# Patient Record
Sex: Female | Born: 1985 | Race: Black or African American | Hispanic: No | Marital: Single | Smoking: Current every day smoker
Health system: Southern US, Community
[De-identification: ages and names within clinical notes are randomized; demographics above are authoritative.]

## PROBLEM LIST (undated history)

## (undated) DIAGNOSIS — E669 Obesity, unspecified: Secondary | ICD-10-CM

## (undated) DIAGNOSIS — J45909 Unspecified asthma, uncomplicated: Secondary | ICD-10-CM

## (undated) DIAGNOSIS — E559 Vitamin D deficiency, unspecified: Secondary | ICD-10-CM

## (undated) HISTORY — DX: Vitamin D deficiency, unspecified: E55.9

## (undated) HISTORY — DX: Obesity, unspecified: E66.9

## (undated) HISTORY — DX: Unspecified asthma, uncomplicated: J45.909

---

## 2013-07-24 ENCOUNTER — Other Ambulatory Visit: Payer: Self-pay | Admitting: Emergency Medicine

## 2013-07-24 DIAGNOSIS — R52 Pain, unspecified: Secondary | ICD-10-CM

## 2013-07-25 ENCOUNTER — Ambulatory Visit
Admission: RE | Admit: 2013-07-25 | Discharge: 2013-07-25 | Disposition: A | Discharge status: Home / Self Care | Payer: BC Managed Care – PPO | Source: Ambulatory Visit | Attending: Emergency Medicine | Admitting: Emergency Medicine

## 2013-07-25 DIAGNOSIS — R52 Pain, unspecified: Secondary | ICD-10-CM

## 2014-01-20 ENCOUNTER — Encounter (HOSPITAL_COMMUNITY): Payer: Self-pay | Admitting: Emergency Medicine

## 2014-01-20 ENCOUNTER — Emergency Department (HOSPITAL_COMMUNITY)
Admission: EM | Admit: 2014-01-20 | Discharge: 2014-01-20 | Disposition: A | Discharge status: Home / Self Care | Payer: BC Managed Care – PPO | Attending: Emergency Medicine | Admitting: Emergency Medicine

## 2014-01-20 ENCOUNTER — Emergency Department (HOSPITAL_COMMUNITY): Payer: BC Managed Care – PPO

## 2014-01-20 DIAGNOSIS — M25561 Pain in right knee: Secondary | ICD-10-CM

## 2014-01-20 DIAGNOSIS — M25569 Pain in unspecified knee: Secondary | ICD-10-CM | POA: Insufficient documentation

## 2014-01-20 DIAGNOSIS — S4980XA Other specified injuries of shoulder and upper arm, unspecified arm, initial encounter: Secondary | ICD-10-CM | POA: Insufficient documentation

## 2014-01-20 DIAGNOSIS — X500XXA Overexertion from strenuous movement or load, initial encounter: Secondary | ICD-10-CM | POA: Insufficient documentation

## 2014-01-20 DIAGNOSIS — Y9389 Activity, other specified: Secondary | ICD-10-CM | POA: Insufficient documentation

## 2014-01-20 DIAGNOSIS — M25511 Pain in right shoulder: Secondary | ICD-10-CM

## 2014-01-20 DIAGNOSIS — Y9289 Other specified places as the place of occurrence of the external cause: Secondary | ICD-10-CM | POA: Insufficient documentation

## 2014-01-20 DIAGNOSIS — M25562 Pain in left knee: Secondary | ICD-10-CM

## 2014-01-20 DIAGNOSIS — S46909A Unspecified injury of unspecified muscle, fascia and tendon at shoulder and upper arm level, unspecified arm, initial encounter: Secondary | ICD-10-CM | POA: Insufficient documentation

## 2014-01-20 NOTE — Discharge Instructions (Signed)
Ice your shoulder intermittently throughout the day. Take ibuprofen, tylenol or aleve for pain. Follow up with one of the resources below to establish care with a primary care doctor.  RESOURCE GUIDE  Chronic Pain Problems: Contact [facility]  [phone] Patients need to be referred by their primary care doctor.  Insufficient Money for Medicine: Contact [employer]:  call "211."   No Primary Care Doctor: - Call [facility]  [phone] - can help you locate a primary care doctor that  accepts your insurance, provides certain services, etc. - Physician Referral Service- [phone]  Agencies that provide inexpensive medical care: - [facility]  [phone] - [facility]  [phone] - [facility]  [phone] - [facility]  [phone] - [facility]  [phone] - [facility]  [phone]  Medicaid-accepting [facility] Providers: - [facility]- [address]  [phone], Mon-Fri 9am-7pm, Sat 9am-1pm - [facility]- [address] [place], [address] [place]  [phone] - [facility]- [address], [address] [place]  [phone] [facility]- [address]  [phone] - Renaye Rakers- [address] [place], [address], [phone]  Only accepts [place] patients after they have their name  applied to their card  Self Pay (no insurance) in [place]: - Sickle Cell Patients: Dr Willey Blade, [facility]  [address] [place], [phone] - [facility]- [address] [place]  [phone]       [facility]- [address]       -     [facility]- see information above (Speak to [employer] if you do not have insurance)       -  [facility]- [address],  [phone]       -  [facility]- [address], [phone]       -  Dr  Julio Sicks-  [address], [place], [phone]       -  [facility] - [address], [phone]       -  [facility]- [address] [phone], also [address], [phone]       -    [facility]- [address] [place], [phone], 1st & 3rd Saturday        every month, 10am-1pm  1) Find a Doctor and Pay Out of Pocket Although you won't have to find out who is covered by your insurance plan, it is a good idea to ask around and get recommendations. You will then need to call the office and see if the doctor you have chosen will accept you as a new patient and what types of options they offer for patients who are self-pay. Some doctors offer discounts or will set up payment plans for their patients who do not have insurance, but you will need to ask so you aren't surprised when you get to your appointment.  2) Contact Your Local Health Department Not all health departments have doctors that can see patients for sick visits, but many do, so it is worth a call to see if yours does. If you don't know where your local health department is, you can check in your phone book. The CDC also  has a tool to help you locate your state's health department, and many state websites also have listings of all of their local health departments.  3) Find a Walk-in Clinic If your illness is not likely to be very severe or complicated, you may want to try a walk in clinic. These are popping up all over the country in pharmacies, drugstores, and shopping centers. They're usually staffed by nurse practitioners or physician assistants that have been trained to treat common illnesses and complaints. They're usually fairly quick and inexpensive. However, if you have serious medical issues or chronic medical problems, these are probably not your best option  Knee Pain The knee is the complex joint between your thigh and your lower leg. It is made up of  bones, tendons, ligaments, and cartilage. The bones that make up the knee are:  The femur in the thigh.  The tibia and fibula in the lower leg.  The patella or kneecap riding in the groove on the lower femur. CAUSES  Knee pain is a common complaint with many causes. A few of these causes are:  Injury, such as:  A ruptured ligament or tendon injury.  Torn cartilage.  Medical conditions, such as:  Gout  Arthritis  Infections  Overuse, over training, or overdoing a physical activity. Knee pain can be minor or severe. Knee pain can accompany debilitating injury. Minor knee problems often respond well to self-care measures or get well on their own. More serious injuries may need medical intervention or even surgery. SYMPTOMS The knee is complex. Symptoms of knee problems can vary widely. Some of the problems are:  Pain with movement and weight bearing.  Swelling and tenderness.  Buckling of the knee.  Inability to straighten or extend your knee.  Your knee locks and you cannot straighten it.  Warmth and redness with pain and fever.  Deformity or dislocation of the kneecap. DIAGNOSIS  Determining what is wrong may be very straight forward such as when there is an injury. It can also be challenging because of the complexity of the knee. Tests to make a diagnosis may include:  Your caregiver taking a history and doing a physical exam.  Routine X-rays can be used to rule out other problems. X-rays will not reveal a cartilage tear. Some injuries of the knee can be diagnosed by:  Arthroscopy a surgical technique by which a small video camera is inserted through tiny incisions on the sides of the knee. This procedure is used to examine and repair internal knee joint problems. Tiny instruments can be used during arthroscopy to repair the torn knee cartilage (meniscus).  Arthrography is a radiology technique. A contrast liquid is directly injected into the knee joint. Internal  structures of the knee joint then become visible on X-ray film.  An MRI scan is a non X-ray radiology procedure in which magnetic fields and a computer produce two- or three-dimensional images of the inside of the knee. Cartilage tears are often visible using an MRI scanner. MRI scans have largely replaced arthrography in diagnosing cartilage tears of the knee.  Blood work.  Examination of the fluid that helps to lubricate the knee joint (synovial fluid). This is done by taking a sample out using a needle and a syringe. TREATMENT The treatment of knee problems depends on the cause. Some of these treatments are:  Depending on the injury, proper casting, splinting, surgery, or physical therapy care will be needed.  Give yourself adequate recovery time. Do not overuse your  joints. If you begin to get sore during workout routines, back off. Slow down or do fewer repetitions.  For repetitive activities such as cycling or running, maintain your strength and nutrition.  Alternate muscle groups. For example, if you are a weight lifter, work the upper body on one day and the lower body the next.  Either tight or weak muscles do not give the proper support for your knee. Tight or weak muscles do not absorb the stress placed on the knee joint. Keep the muscles surrounding the knee strong.  Take care of mechanical problems.  If you have flat feet, orthotics or special shoes may help. See your caregiver if you need help.  Arch supports, sometimes with wedges on the inner or outer aspect of the heel, can help. These can shift pressure away from the side of the knee most bothered by osteoarthritis.  A brace called an "unloader" brace also may be used to help ease the pressure on the most arthritic side of the knee.  If your caregiver has prescribed crutches, braces, wraps or ice, use as directed. The acronym for this is PRICE. This means protection, rest, ice, compression, and elevation.  Nonsteroidal  anti-inflammatory drugs (NSAIDs), can help relieve pain. But if taken immediately after an injury, they may actually increase swelling. Take NSAIDs with food in your stomach. Stop them if you develop stomach problems. Do not take these if you have a history of ulcers, stomach pain, or bleeding from the bowel. Do not take without your caregiver's approval if you have problems with fluid retention, heart failure, or kidney problems.  For ongoing knee problems, physical therapy may be helpful.  Glucosamine and chondroitin are over-the-counter dietary supplements. Both may help relieve the pain of osteoarthritis in the knee. These medicines are different from the usual anti-inflammatory drugs. Glucosamine may decrease the rate of cartilage destruction.  Injections of a corticosteroid drug into your knee joint may help reduce the symptoms of an arthritis flare-up. They may provide pain relief that lasts a few months. You may have to wait a few months between injections. The injections do have a small increased risk of infection, water retention, and elevated blood sugar levels.  Hyaluronic acid injected into damaged joints may ease pain and provide lubrication. These injections may work by reducing inflammation. A series of shots may give relief for as long as 6 months.  Topical painkillers. Applying certain ointments to your skin may help relieve the pain and stiffness of osteoarthritis. Ask your pharmacist for suggestions. Many over the-counter products are approved for temporary relief of arthritis pain.  In some countries, doctors often prescribe topical NSAIDs for relief of chronic conditions such as arthritis and tendinitis. A review of treatment with NSAID creams found that they worked as well as oral medications but without the serious side effects. PREVENTION  Maintain a healthy weight. Extra pounds put more strain on your joints.  Get strong, stay limber. Weak muscles are a common cause of knee  injuries. Stretching is important. Include flexibility exercises in your workouts.  Be smart about exercise. If you have osteoarthritis, chronic knee pain or recurring injuries, you may need to change the way you exercise. This does not mean you have to stop being active. If your knees ache after jogging or playing basketball, consider switching to swimming, water aerobics, or other low-impact activities, at least for a few days a week. Sometimes limiting high-impact activities will provide relief.  Make sure your shoes fit well. Choose  footwear that is right for your sport.  Protect your knees. Use the proper gear for knee-sensitive activities. Use kneepads when playing volleyball or laying carpet. Buckle your seat belt every time you drive. Most shattered kneecaps occur in car accidents.  Rest when you are tired. SEEK MEDICAL CARE IF:  You have knee pain that is continual and does not seem to be getting better.  SEEK IMMEDIATE MEDICAL CARE IF:  Your knee joint feels hot to the touch and you have a high fever. MAKE SURE YOU:   Understand these instructions.  Will watch your condition.  Will get help right away if you are not doing well or get worse. Document Released: 03/08/2007 Document Revised: 08/03/2011 Document Reviewed: 03/08/2007 [facility] Patient Information 2015 [place], [place]. This information is not intended to replace advice given to you by your health care provider. Make sure you discuss any questions you have with your health care provider.  Shoulder Pain The shoulder is the joint that connects your arms to your body. The bones that form the shoulder joint include the upper arm bone (humerus), the shoulder blade (scapula), and the collarbone (clavicle). The top of the humerus is shaped like a ball and fits into a rather flat socket on the scapula (glenoid cavity). A combination of muscles and strong, fibrous tissues that connect muscles to bones (tendons) support your shoulder  joint and hold the ball in the socket. Small, fluid-filled sacs (bursae) are located in different areas of the joint. They act as cushions between the bones and the overlying soft tissues and help reduce friction between the gliding tendons and the bone as you move your arm. Your shoulder joint allows a wide range of motion in your arm. This range of motion allows you to do things like scratch your back or throw a ball. However, this range of motion also makes your shoulder more prone to pain from overuse and injury. Causes of shoulder pain can originate from both injury and overuse and usually can be grouped in the following four categories:  Redness, swelling, and pain (inflammation) of the tendon (tendinitis) or the bursae (bursitis).  Instability, such as a dislocation of the joint.  Inflammation of the joint (arthritis).  Broken bone (fracture). HOME CARE INSTRUCTIONS   Apply ice to the sore area.  Put ice in a plastic bag.  Place a towel between your skin and the bag.  Leave the ice on for 15-20 minutes, 3-4 times per day for the first 2 days, or as directed by your health care provider.  Stop using cold packs if they do not help with the pain.  If you have a shoulder sling or immobilizer, wear it as long as your caregiver instructs. Only remove it to shower or bathe. Move your arm as little as possible, but keep your hand moving to prevent swelling.  Squeeze a soft ball or foam pad as much as possible to help prevent swelling.  Only take over-the-counter or prescription medicines for pain, discomfort, or fever as directed by your caregiver. SEEK MEDICAL CARE IF:   Your shoulder pain increases, or new pain develops in your arm, hand, or fingers.  Your hand or fingers become cold and numb.  Your pain is not relieved with medicines. SEEK IMMEDIATE MEDICAL CARE IF:   Your arm, hand, or fingers are numb or tingling.  Your arm, hand, or fingers are significantly swollen or turn  white or blue. MAKE SURE YOU:   Understand these instructions.  Will  watch your condition.  Will get help right away if you are not doing well or get worse. Document Released: 02/18/2005 Document Revised: 09/25/2013 Document Reviewed: 04/25/2011 [facility] Patient Information 2015 [place], [place]. This information is not intended to replace advice given to you by your health care provider. Make sure you discuss any questions you have with your health care provider.

## 2014-01-20 NOTE — ED Provider Notes (Signed)
CSN: [id number]     Arrival date & time 01/20/14  4098 History   First MD Initiated Contact with Patient 01/20/14 1005     Chief Complaint  Patient presents with  . Shoulder Pain     (Consider location/radiation/quality/duration/timing/severity/associated sxs/prior Treatment) HPI Comments: Patient is a 28 year old female who presents to the emergency department complaining of right shoulder pain x2 days. Patient reports she jumped out of a trailer 2 days ago and felt her right shoulder "pop". Pain has been intermittent since, worse when she moves her arm out to the side, temporarily relief by Aleve. Pain rated 3/10. Denies numbness or tingling. Patient is also complaining of bilateral knee pain that has been present for 5 months. She reports she was playing softball when she started to experience left knee pain, shortly after experienced right knee pain. Pain worse she walks for a long period of time. She does not have a primary care physician.  The history is provided by the patient.    History reviewed. No pertinent past medical history. History reviewed. No pertinent past surgical history. History reviewed. No pertinent family history. History  Substance Use Topics  . Smoking status: Never Smoker   . Smokeless tobacco: Not on file  . Alcohol Use: Yes   OB History   Grav Para Term Preterm Abortions TAB SAB Ect Mult Living                 Review of Systems  Constitutional: Negative.   HENT: Negative.   Cardiovascular: Negative.   Musculoskeletal:       + R shoulder pain. + bilateral knee pain.  Skin: Negative.       Allergies  Diflucan  Home Medications   Prior to Admission medications   Not on File   BP 113/68  Pulse 75  Temp(Src) 98.5 F (36.9 C) (Oral)  Resp 20  SpO2 100%  LMP 12/01/2013 Physical Exam  Nursing note and vitals reviewed. Constitutional: She is oriented to person, place, and time. She appears well-developed and well-nourished. No distress.   HENT:  Head: Normocephalic and atraumatic.  Mouth/Throat: Oropharynx is clear and moist.  Eyes: Conjunctivae and EOM are normal.  Neck: Normal range of motion. Neck supple.  Cardiovascular: Normal rate, regular rhythm, normal heart sounds and intact distal pulses.   Pulmonary/Chest: Effort normal and breath sounds normal. No respiratory distress.  Musculoskeletal: Normal range of motion. She exhibits no edema.  Right shoulder TTP over acromion process. FROM, pain with abduction. No swelling or deformity. Bilateral knees with mild tenderness around patella area. Full range of motion. No crepitus. No ligamentous laxity. No swelling or deformity. Normal gait.  Neurological: She is alert and oriented to person, place, and time. No sensory deficit.  Skin: Skin is warm and dry.  Psychiatric: She has a normal mood and affect. Her behavior is normal.    ED Course  Procedures (including critical care time) Labs Review Labs Reviewed - No data to display  Imaging Review Dg Shoulder Right  01/20/2014   CLINICAL DATA:  Injury.  Right shoulder pain.  EXAM: RIGHT SHOULDER - 2+ VIEW  COMPARISON:  None.  FINDINGS: There is no evidence of fracture or dislocation. There is no evidence of arthropathy or other focal bone abnormality. Soft tissues are unremarkable.  IMPRESSION: Negative.   Electronically Signed   By: Amie Portland M.D.   On: 01/20/2014 11:29     EKG Interpretation None      MDM   Final  diagnoses:  Right shoulder pain  Bilateral knee pain    With right shoulder pain after injury. She is well appearing and in no apparent distress. Neurovascularly intact. Vital signs stable. X-ray without any acute findings. Reguarding knee pain, this has been ongoing for about 5 months. I do not feel imaging studies are necessary at this time. Ambulating without difficulty. Resources given for PCP followup. Advised rest, ice and NSAIDs. Stable for discharge. Return precautions given. Patient states  understanding of treatment care plan and is agreeable.    Trevor Mace, PA-C 01/20/14 1141

## 2014-01-20 NOTE — ED Notes (Signed)
Reports right shoulder pain/inj onset Thursday am.  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

## 2014-01-22 NOTE — ED Provider Notes (Signed)
Medical screening examination/treatment/procedure(s) were performed by non-physician practitioner and as supervising physician I was immediately available for consultation/collaboration.   EKG Interpretation None        Mehlani Blankenburg, MD 01/22/14 1117 

## 2014-08-10 IMAGING — US US TRANSVAGINAL NON-OB
1 series · 14 of 25 positions shown · non-contrast
Comparison: None

CLINICAL DATA: Left-sided pelvic pain.  LMP 07/25/2013.

EXAM:
TRANSABDOMINAL AND TRANSVAGINAL ULTRASOUND OF PELVIS
TECHNIQUE: Both transabdominal and transvaginal ultrasound examinations of the
pelvis were performed. Transabdominal technique was performed for
global imaging of the pelvis including uterus, ovaries, adnexal
regions, and pelvic cul-de-sac. It was necessary to proceed with
endovaginal exam following the transabdominal exam to visualize the
endometrium and ovaries.

[Series 1: us transvaginal non-ob · 0.28mm/px · 14 of 74 slices shown]
[im 1/74]
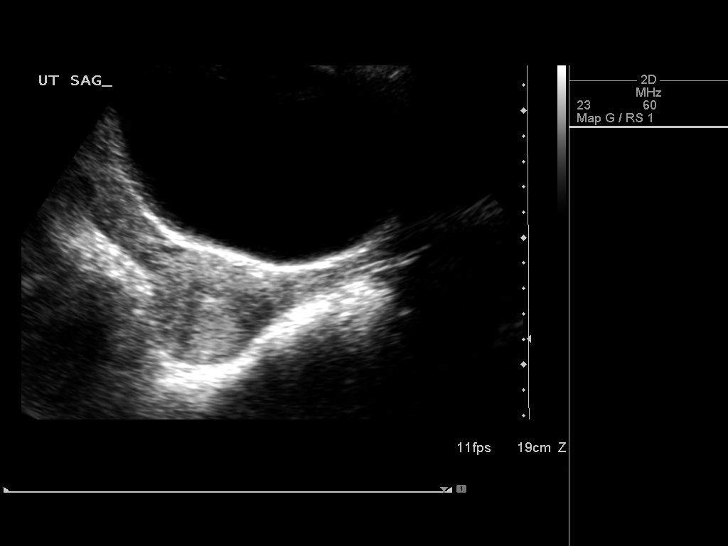
[im 7/74]
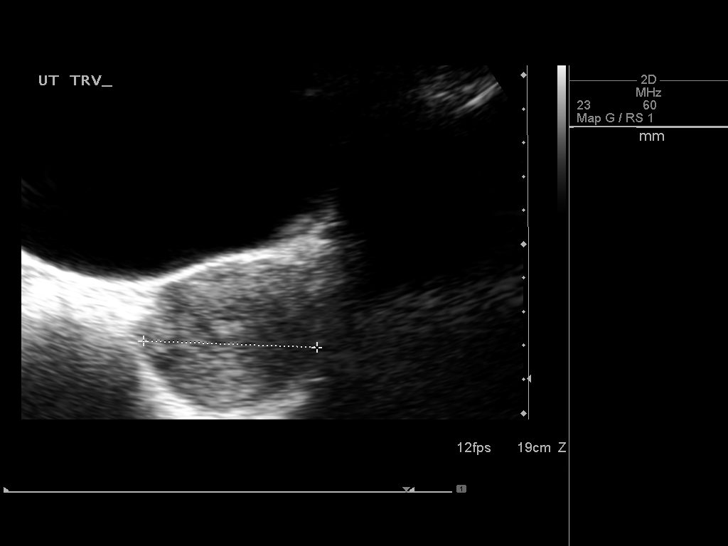
[im 13/74]
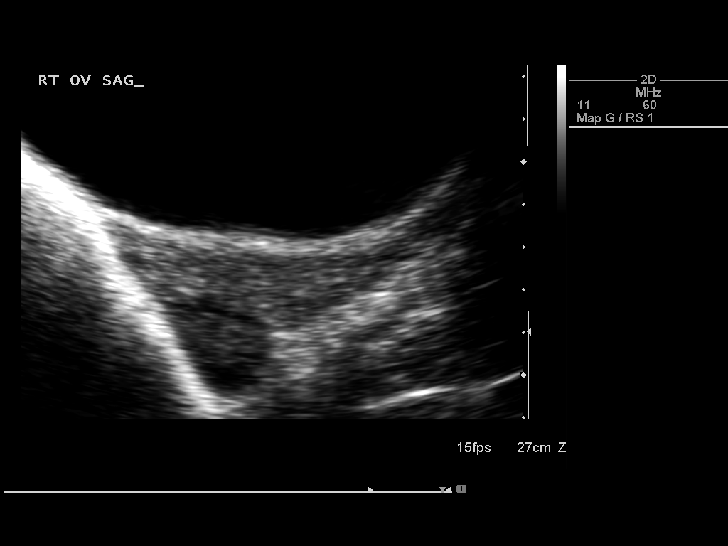
[im 19/74]
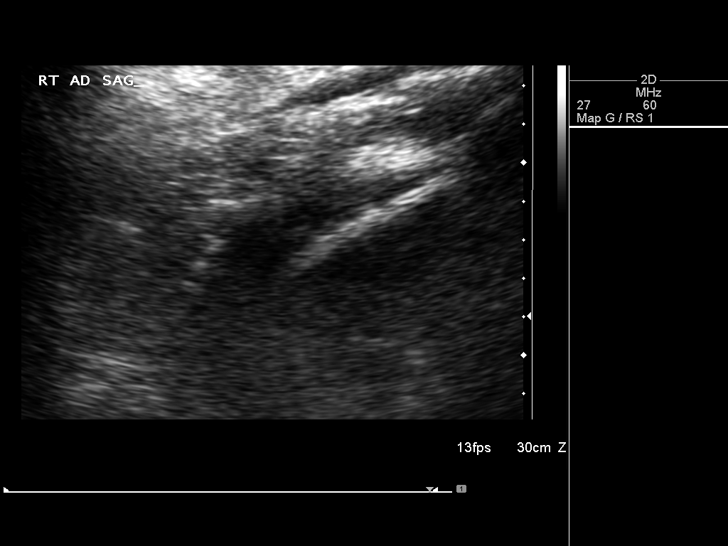
[im 25/74]
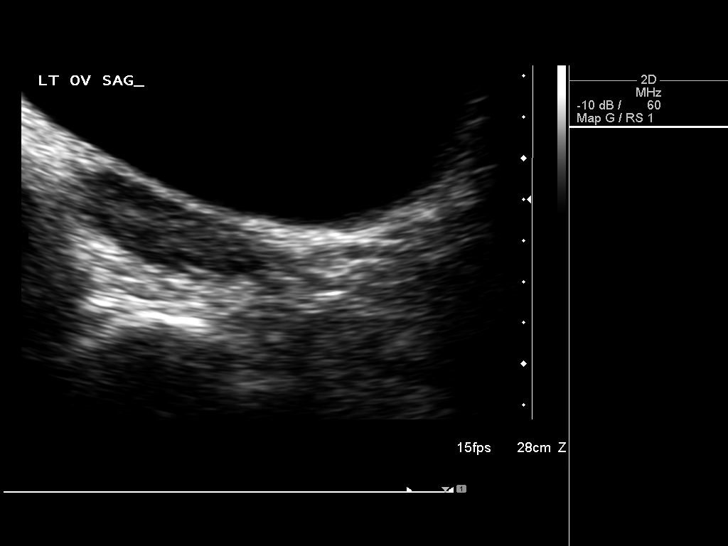
[im 28/74]
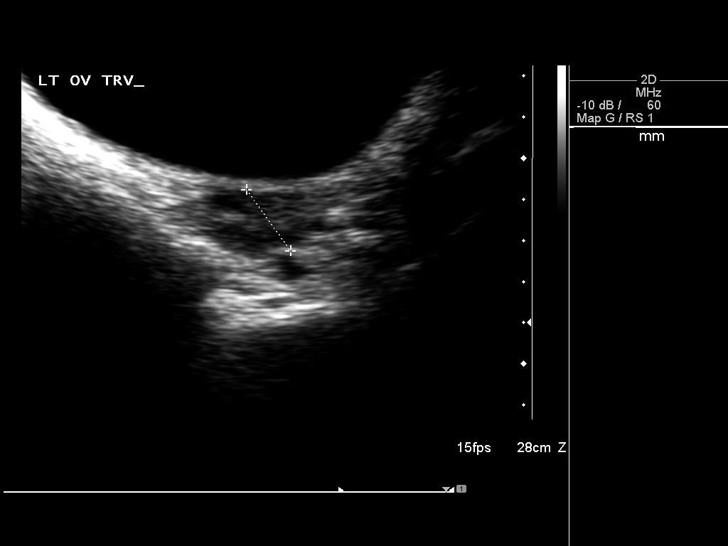
[im 34/74]
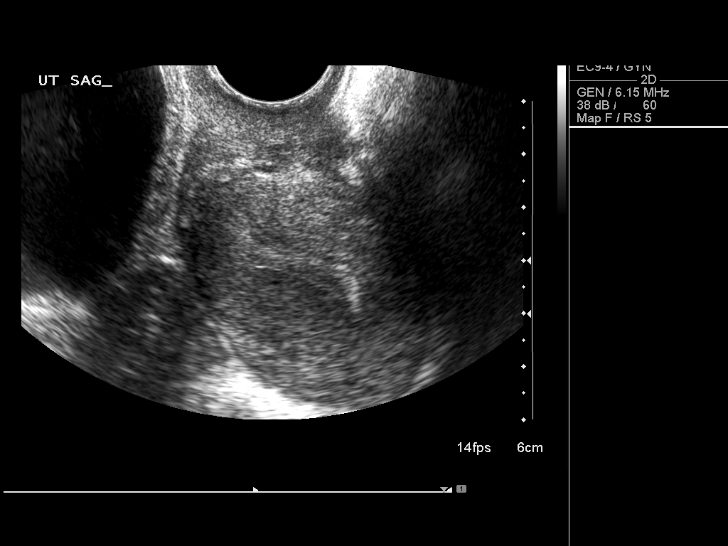
[im 40/74]
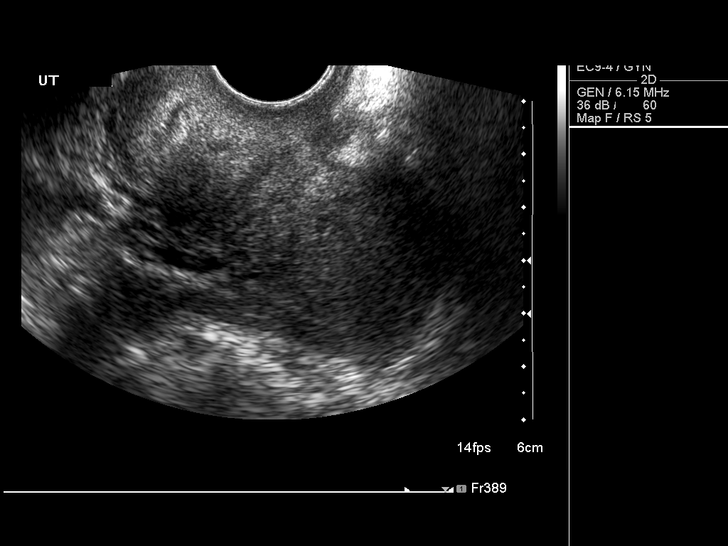
[im 46/74]
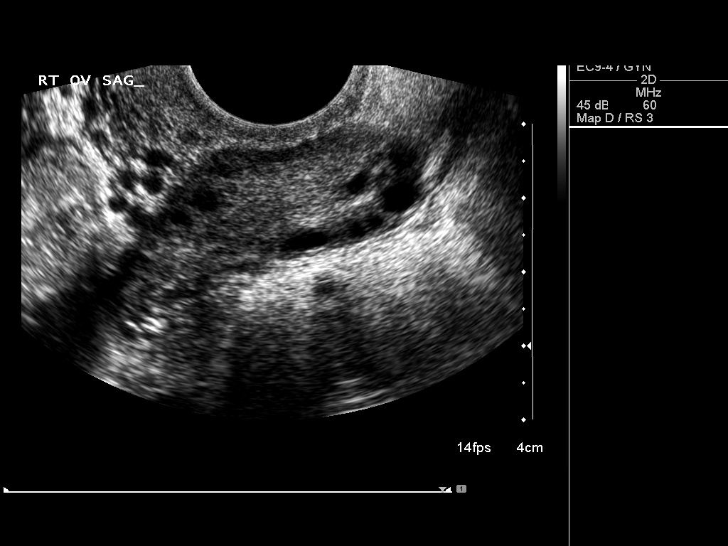
[im 49/74]
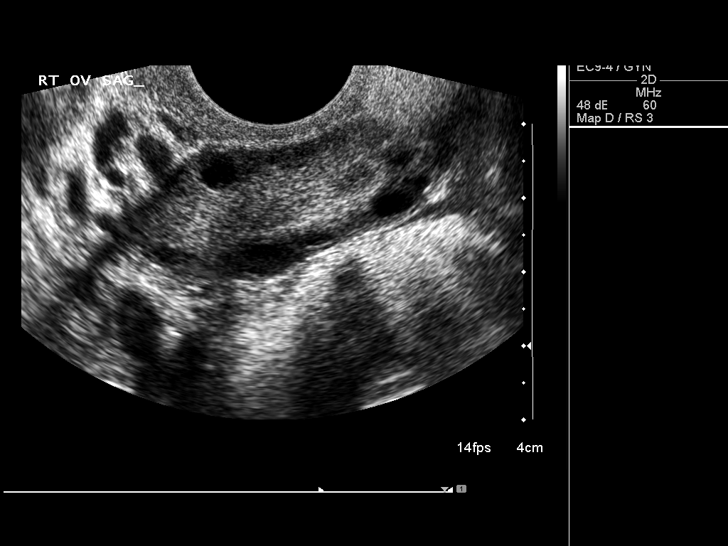
[im 55/74]
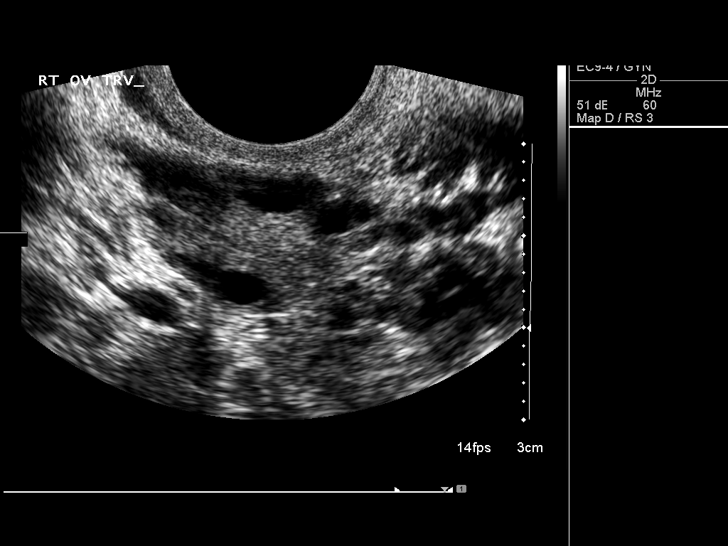
[im 61/74]
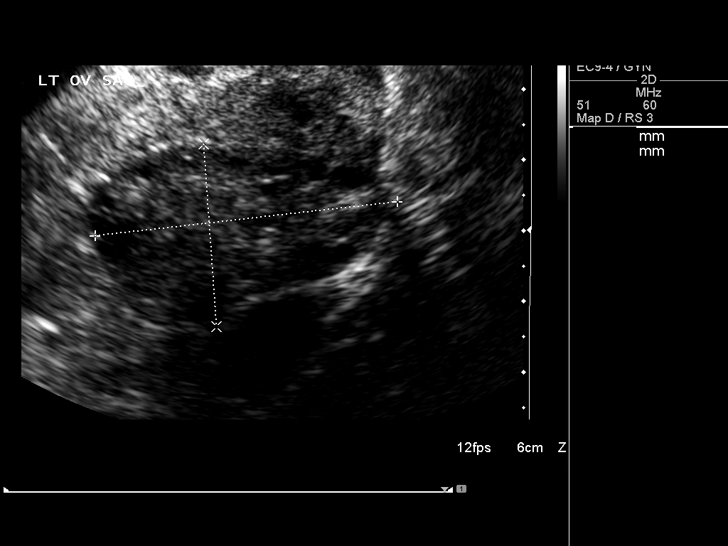
[im 67/74]
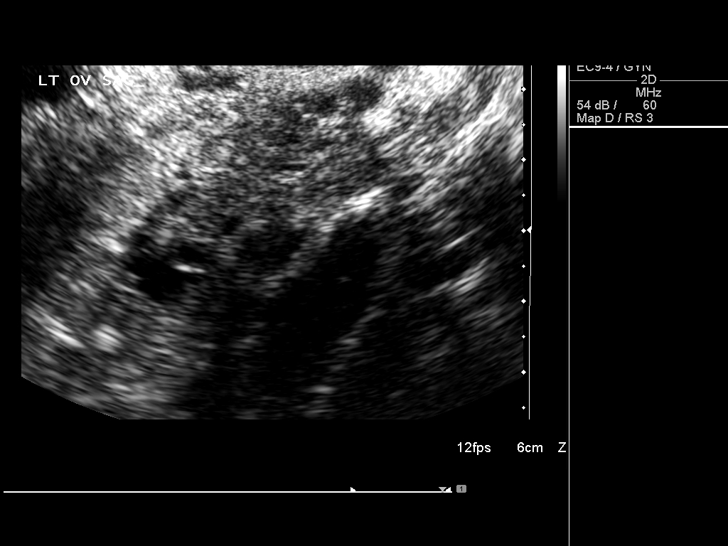
[im 74/74]
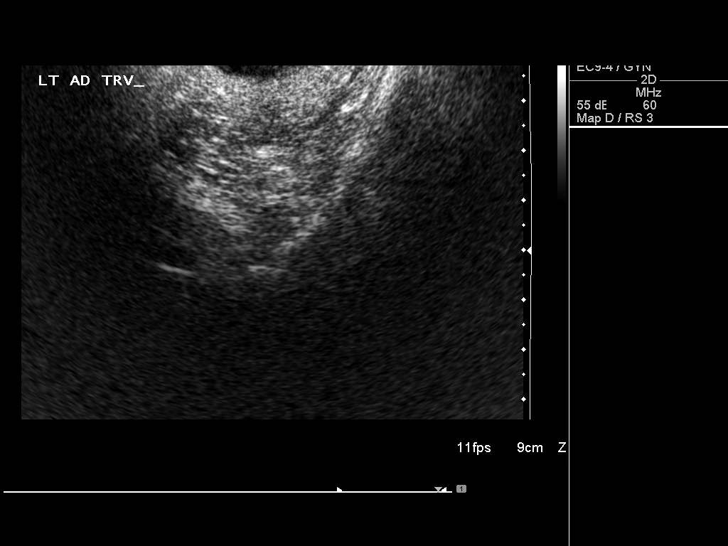

[14 of 25 positions shown; findings below may reference images not displayed]

FINDINGS: Uterus

Measurements: 6.7 x 4.6 x 4.4 cm. Retroverted. No fibroids or other
mass visualized.

Endometrium

Thickness: 8 mm.  No focal abnormality visualized.

Right ovary

Measurements: 4.8 x 1.7 x 2.4 cm. Normal appearance/no adnexal mass.

Left ovary

Measurements: 4.3 x 2.6 x 2.1 cm. Normal appearance/no adnexal mass.

Other findings

No free fluid.
IMPRESSION: Normal appearance of uterus and both ovaries. No pelvic mass or
other significant abnormality identified.

## 2015-01-21 ENCOUNTER — Other Ambulatory Visit (HOSPITAL_COMMUNITY): Payer: Self-pay | Admitting: Student

## 2015-01-21 DIAGNOSIS — R0789 Other chest pain: Secondary | ICD-10-CM

## 2015-01-21 DIAGNOSIS — R0602 Shortness of breath: Secondary | ICD-10-CM

## 2015-01-24 ENCOUNTER — Ambulatory Visit (HOSPITAL_COMMUNITY)
Admission: RE | Admit: 2015-01-24 | Discharge: 2015-01-24 | Disposition: A | Discharge status: Home / Self Care | Payer: PRIVATE HEALTH INSURANCE | Source: Ambulatory Visit | Attending: Internal Medicine | Admitting: Internal Medicine

## 2015-01-24 DIAGNOSIS — R079 Chest pain, unspecified: Secondary | ICD-10-CM | POA: Insufficient documentation

## 2015-01-24 DIAGNOSIS — I371 Nonrheumatic pulmonary valve insufficiency: Secondary | ICD-10-CM | POA: Insufficient documentation

## 2015-01-24 DIAGNOSIS — R0789 Other chest pain: Secondary | ICD-10-CM | POA: Diagnosis not present

## 2015-01-24 DIAGNOSIS — R0602 Shortness of breath: Secondary | ICD-10-CM

## 2015-01-24 NOTE — Progress Notes (Signed)
Echocardiogram 2D Echocardiogram has been performed.  Ashley Manning 01/24/2015, 11:03 AM

## 2015-02-05 IMAGING — CR DG SHOULDER 2+V*R*
3 series · 3 of 3 positions shown · non-contrast
Comparison: None.

CLINICAL DATA: Injury.  Right shoulder pain.

EXAM:
RIGHT SHOULDER - 2+ VIEW

[w shoulder ap internal righ *]
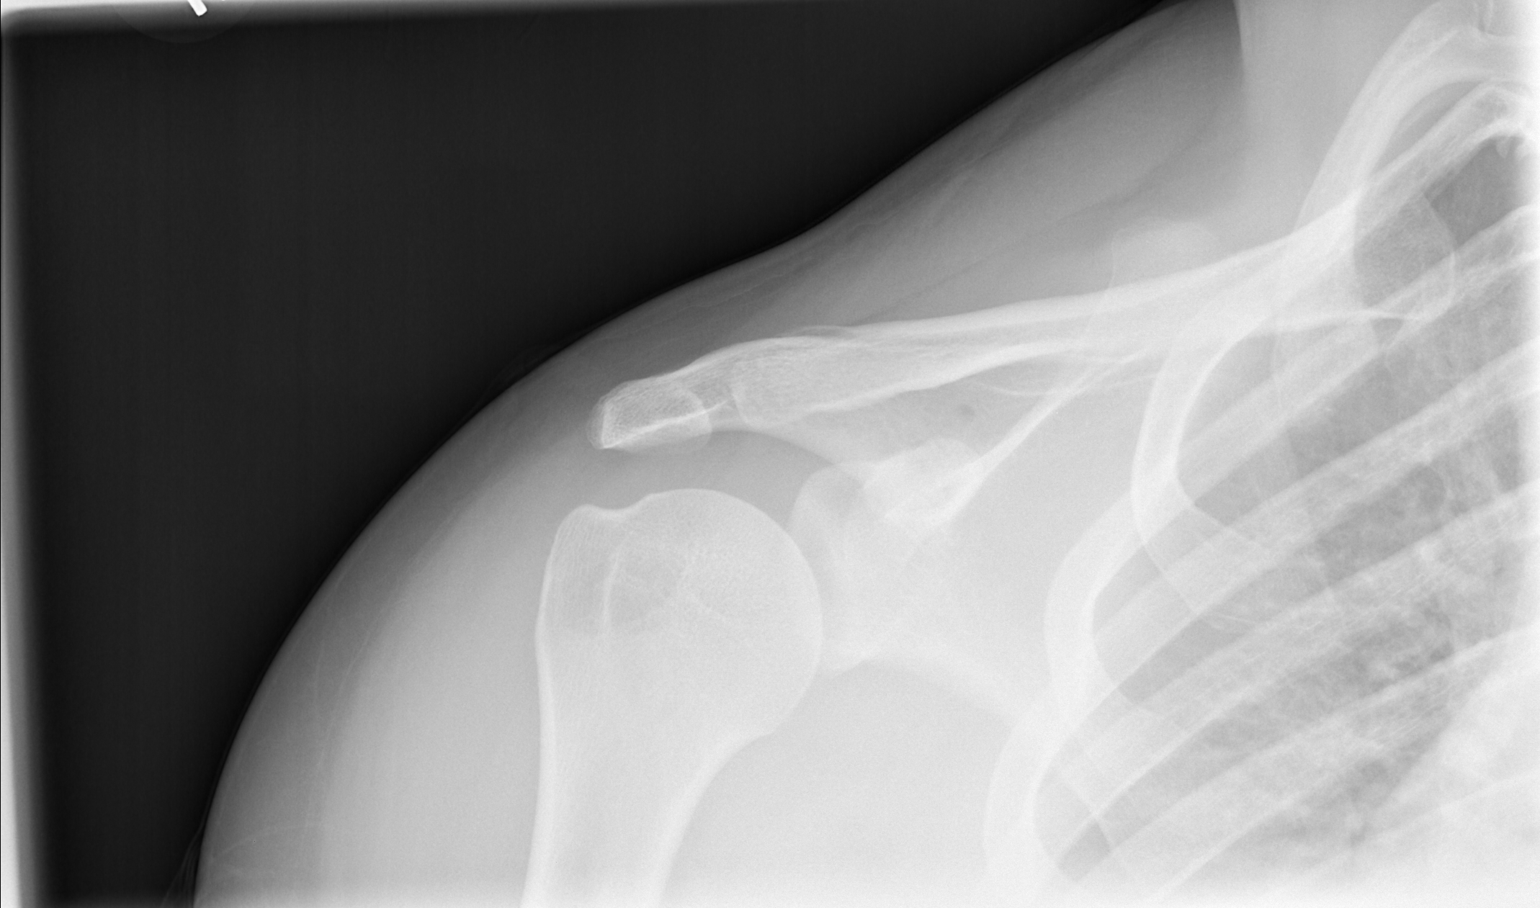

[w shoulder y view right *]
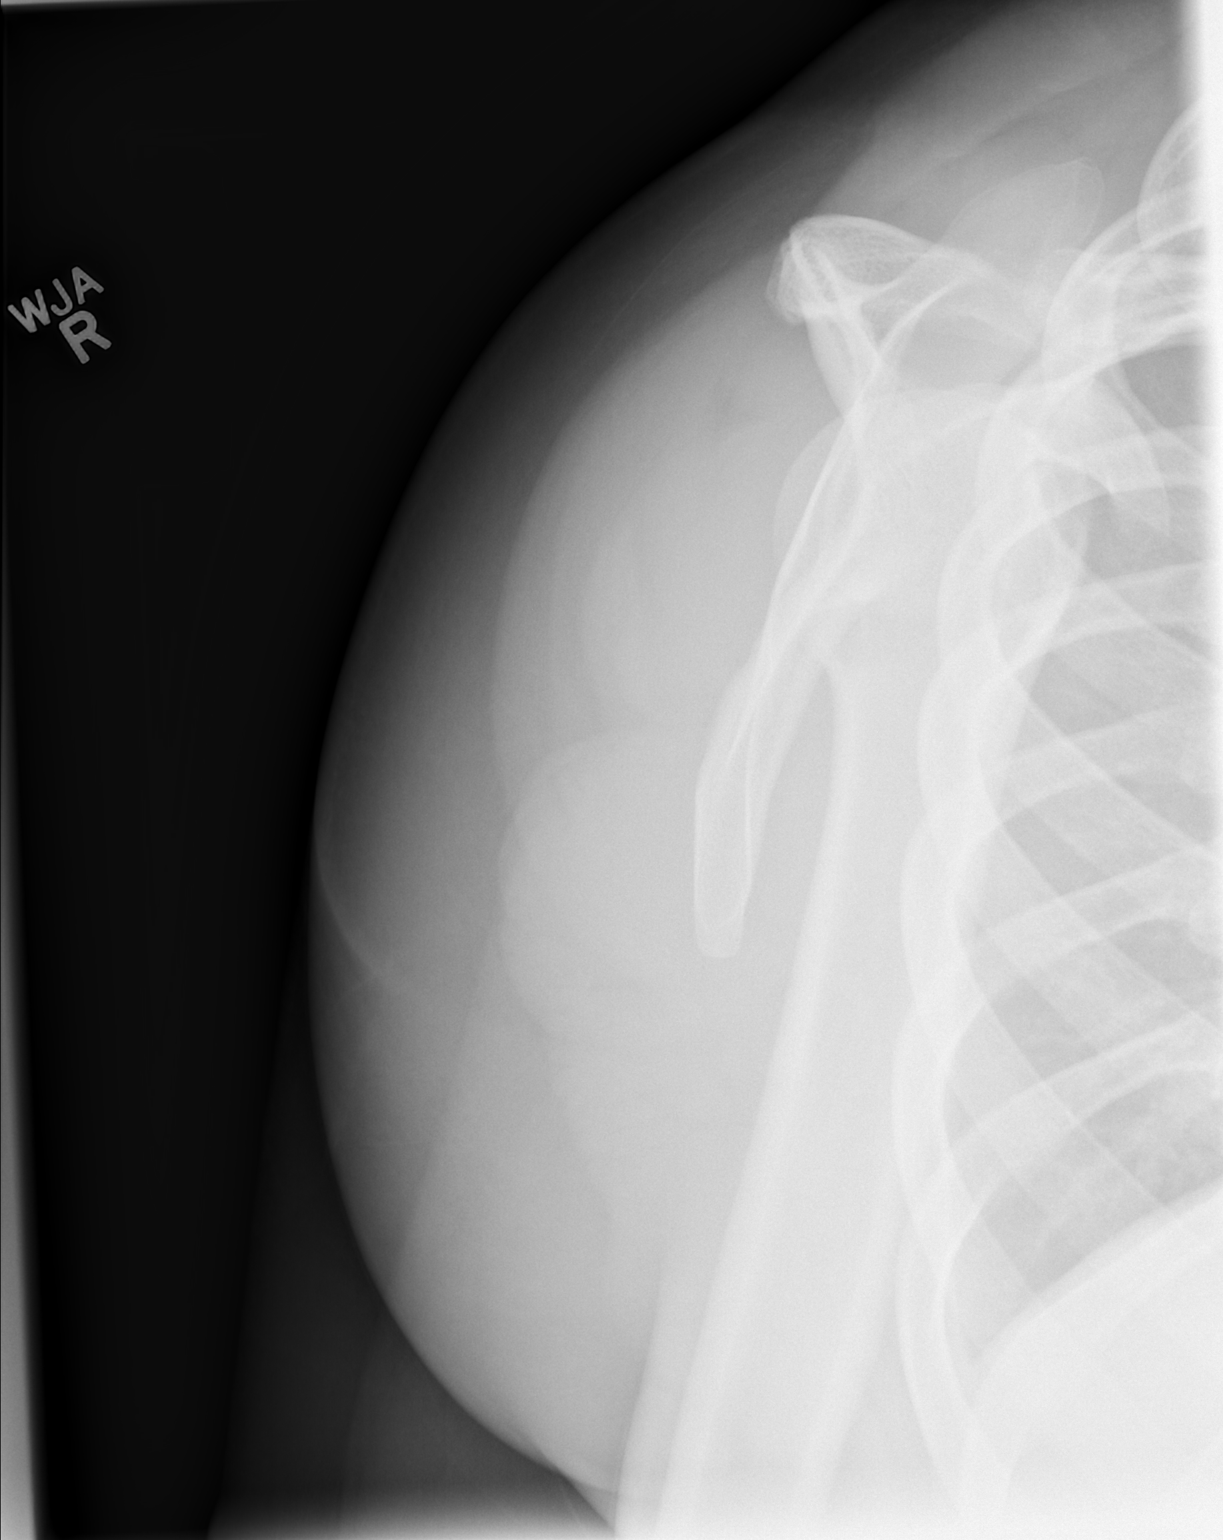

[x shoulder axillary right]
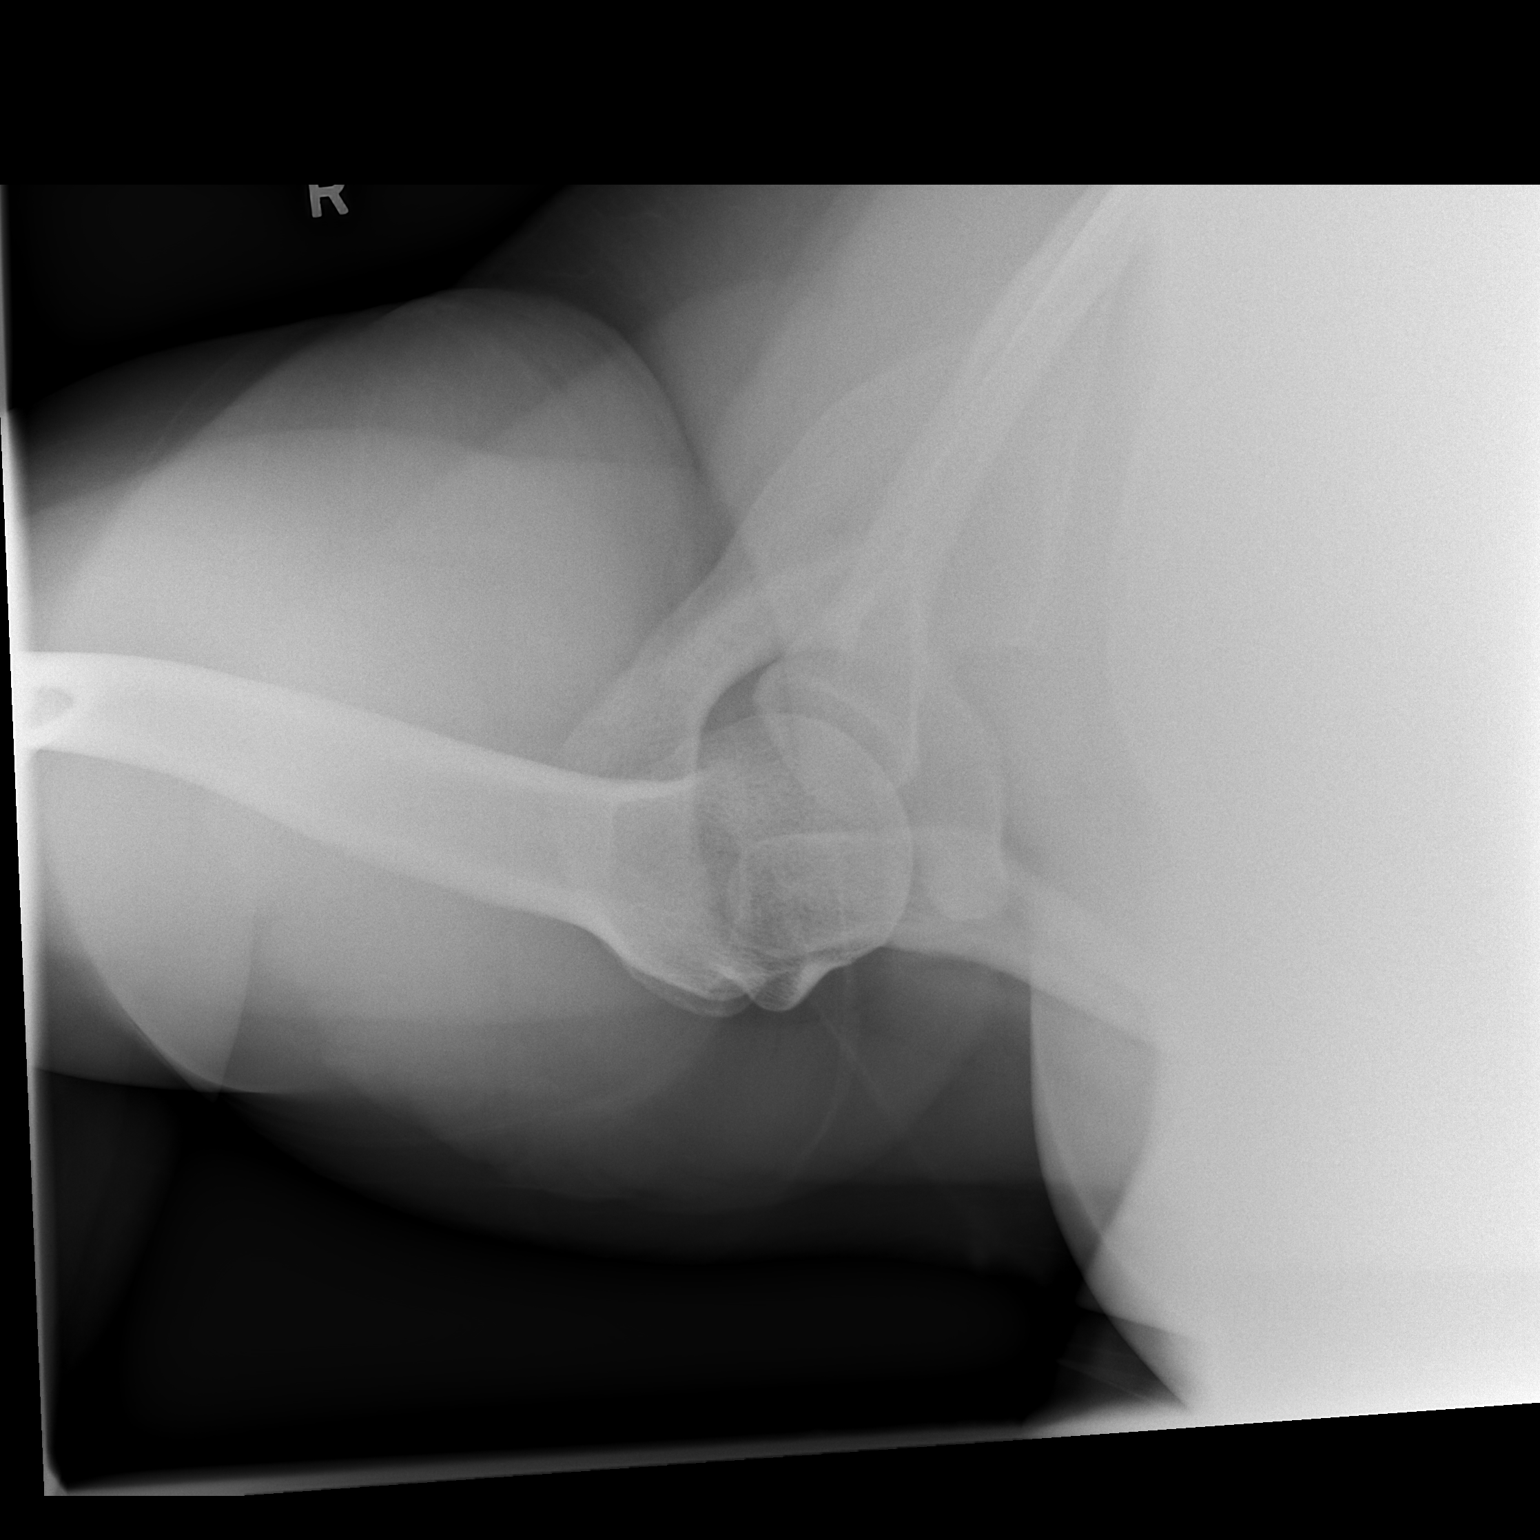

[3 of 3 positions shown; findings below may reference images not displayed]

FINDINGS: There is no evidence of fracture or dislocation. There is no
evidence of arthropathy or other focal bone abnormality. Soft
tissues are unremarkable.
IMPRESSION: Negative.

## 2016-08-18 ENCOUNTER — Encounter: Payer: Self-pay | Admitting: Family Medicine

## 2016-08-18 ENCOUNTER — Ambulatory Visit (INDEPENDENT_AMBULATORY_CARE_PROVIDER_SITE_OTHER): Payer: No Typology Code available for payment source | Admitting: Family Medicine

## 2016-08-18 ENCOUNTER — Other Ambulatory Visit (HOSPITAL_COMMUNITY)
Admission: RE | Admit: 2016-08-18 | Discharge: 2016-08-18 | Disposition: A | Discharge status: Home / Self Care | Payer: PRIVATE HEALTH INSURANCE | Source: Ambulatory Visit | Attending: Family Medicine | Admitting: Family Medicine

## 2016-08-18 VITALS — BP 116/78 | HR 75 | Ht 66.0 in | Wt 250.2 lb

## 2016-08-18 DIAGNOSIS — Z124 Encounter for screening for malignant neoplasm of cervix: Secondary | ICD-10-CM

## 2016-08-18 DIAGNOSIS — Z113 Encounter for screening for infections with a predominantly sexual mode of transmission: Secondary | ICD-10-CM | POA: Insufficient documentation

## 2016-08-18 DIAGNOSIS — E669 Obesity, unspecified: Secondary | ICD-10-CM

## 2016-08-18 DIAGNOSIS — H6122 Impacted cerumen, left ear: Secondary | ICD-10-CM | POA: Diagnosis not present

## 2016-08-18 DIAGNOSIS — Z0000 Encounter for general adult medical examination without abnormal findings: Secondary | ICD-10-CM | POA: Diagnosis not present

## 2016-08-18 DIAGNOSIS — Z8349 Family history of other endocrine, nutritional and metabolic diseases: Secondary | ICD-10-CM

## 2016-08-18 HISTORY — DX: Obesity, unspecified: E66.9

## 2016-08-18 LAB — POCT URINALYSIS DIPSTICK
Bilirubin, UA: NEGATIVE
Blood, UA: NEGATIVE
Glucose, UA: NEGATIVE
Ketones, UA: NEGATIVE
Leukocytes, UA: NEGATIVE
Nitrite, UA: POSITIVE
Protein, UA: NEGATIVE
Spec Grav, UA: 1.025
Urobilinogen, UA: NEGATIVE
pH, UA: 6

## 2016-08-18 LAB — CBC WITH DIFFERENTIAL/PLATELET
BASOS ABS: 62 {cells}/uL (ref 0–200)
Basophils Relative: 1 %
EOS PCT: 5 %
Eosinophils Absolute: 310 cells/uL (ref 15–500)
HEMATOCRIT: 43.3 % (ref 35.0–45.0)
HEMOGLOBIN: 13.9 g/dL (ref 11.7–15.5)
LYMPHS ABS: 1798 {cells}/uL (ref 850–3900)
Lymphocytes Relative: 29 %
MCH: 28.1 pg (ref 27.0–33.0)
MCHC: 32.1 g/dL (ref 32.0–36.0)
MCV: 87.5 fL (ref 80.0–100.0)
MONO ABS: 372 {cells}/uL (ref 200–950)
MPV: 11.4 fL (ref 7.5–12.5)
Monocytes Relative: 6 %
NEUTROS PCT: 59 %
Neutro Abs: 3658 cells/uL (ref 1500–7800)
Platelets: 319 10*3/uL (ref 140–400)
RBC: 4.95 MIL/uL (ref 3.80–5.10)
RDW: 12.7 % (ref 11.0–15.0)
WBC: 6.2 10*3/uL (ref 4.0–10.5)

## 2016-08-18 LAB — COMPREHENSIVE METABOLIC PANEL WITH GFR
ALT: 14 U/L (ref 6–29)
AST: 25 U/L (ref 10–30)
Albumin: 4.2 g/dL (ref 3.6–5.1)
Alkaline Phosphatase: 57 U/L (ref 33–115)
BUN: 16 mg/dL (ref 7–25)
CO2: 18 mmol/L — ABNORMAL LOW (ref 20–31)
Calcium: 9.4 mg/dL (ref 8.6–10.2)
Chloride: 106 mmol/L (ref 98–110)
Creat: 0.98 mg/dL (ref 0.50–1.10)
Glucose, Bld: 91 mg/dL (ref 65–99)
Potassium: 3.9 mmol/L (ref 3.5–5.3)
Sodium: 135 mmol/L (ref 135–146)
Total Bilirubin: 0.7 mg/dL (ref 0.2–1.2)
Total Protein: 7.4 g/dL (ref 6.1–8.1)

## 2016-08-18 LAB — TSH: TSH: 1.6 m[IU]/L

## 2016-08-18 NOTE — Patient Instructions (Signed)

## 2016-08-18 NOTE — Progress Notes (Signed)
Subjective:    Patient ID: Ashley Manning, female    DOB: 08/06/1985, 31 y.o.   MRN: [id number]  HPI Chief Complaint  Patient presents with  . new pt    new pt cpe. not fasting   She is new to the practice and here for a complete physical exam. [facility]  Last CPE: August 2015  Complains of left ear feeling clogged and sounds being muffled. Denies ear pain.   Other providers: eye doctor Dr. Dione BoozeGroat.   Past medical history: asthma as a child. Exercise induced. Has not had an issue in years. No inhaler.  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 Maters in counseling.  Smokes 3 cigarettes per week. Drinks alcohol- 1 drink per day, denies drug use  Diet: tries to eat a low carb diet. Limits pork and red meats.  Excerise: 3 days per week. Flag football and other activities.   Immunizations: Tdap 2015. Flu shot- no.   Health maintenance:  Mammogram: N/A Colonoscopy: N/A Last Pap Smear: 2016 and normal but +HPV  Last Menstrual cycle: 06/29/2016 Pregnancies: 0 States she does not have the kind of sex that can get her pregnant. Does not want a pregnancy test.  Does want STD testing including HSV. 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 States she had one outbreak in the past. Last Dental Exam: December 2016  Last Eye Exam: June 2016, prescription glasses, has upcoming appointment.   Wears seatbelt always, smoke detectors in home and functioning, does not text while driving and feels safe in home environment.   Reviewed allergies, medications, past medical, surgical, family, and social history.    Review of Systems Review of Systems Constitutional: -fever, -chills, -sweats, -unexpected weight change,-fatigue ENT: -runny nose, -ear pain, -sore throat Cardiology:  -chest pain, -palpitations, -edema Respiratory: -cough, -shortness of breath, -wheezing Gastroenterology: -abdominal  pain, -nausea, -vomiting, -diarrhea, -constipation  Hematology: -bleeding or bruising problems Musculoskeletal: -arthralgias, -myalgias, -joint swelling, -back pain Ophthalmology: -vision changes Urology: -dysuria, -difficulty urinating, -hematuria, -urinary frequency, -urgency Neurology: -headache, -weakness, -tingling, -numbness       Objective:   Physical Exam BP 116/78   Pulse 75   Ht 5\' 6"  (1.676 m)   Wt 250 lb 3.2 oz (113.5 kg)   LMP 06/29/2016   BMI 40.38 kg/m   General Appearance:    Alert, cooperative, no distress, appears stated age  Head:    Normocephalic, without obvious abnormality, atraumatic  Eyes:    PERRL, conjunctiva/corneas clear, EOM's intact, fundi    benign  Ears:    Normal TM's and external ear canals post ear lavage. Initial exam showed left ear canal with cerumen impaction and unable to visualize left TM.   Nose:   Nares normal, mucosa normal, no drainage or sinus   Tenderness.   Throat:   Lips, mucosa, and tongue normal; teeth and gums normal  Neck:   Supple, no lymphadenopathy;  thyroid:  no   enlargement/tenderness/nodules; no carotid   bruit or JVD  Back:    Spine nontender, no curvature, ROM normal, no CVA     tenderness  Lungs:     Clear to auscultation bilaterally without wheezes, rales or     ronchi; respirations unlabored  Chest Wall:    No tenderness or deformity   Heart:    Regular rate and rhythm, S1 and S2 normal, no murmur, rub   or gallop  Breast  Exam:    No tenderness, masses, or nipple discharge or inversion.      No axillary lymphadenopathy  Abdomen:     Soft, non-tender, nondistended, normoactive bowel sounds,    no masses, no hepatosplenomegaly  Genitalia:    Normal external genitalia without lesions.  BUS and vagina normal; cervix without lesions, or cervical motion tenderness. No abnormal vaginal discharge.  Uterus and adnexa not enlarged, nontender, no masses.  Pap performed. Chaperone present.   Rectal:    Not performed due to  age<40 and no related complaints  Extremities:   No clubbing, cyanosis or edema  Pulses:   2+ and symmetric all extremities  Skin:   Skin color, texture, turgor normal, no rashes or lesions  Lymph nodes:   Cervical, supraclavicular, and axillary nodes normal  Neurologic:   CNII-XII intact, normal strength, sensation and gait; reflexes 2+ and symmetric throughout          Psych:   Normal mood, affect, hygiene and grooming.    Urinalysis dipstick: nit trace, negative otherwise     Assessment & Plan:  Routine general medical examination at a health care facility - Plan: Urinalysis Dipstick, CBC with Differential/Platelet, Comprehensive metabolic panel, TSH  Screening for cervical cancer - Plan: Cytology - PAP  Screening for STD (sexually transmitted disease) - Plan: RPR, HIV antibody, Cytology - PAP  Family history of thyroid disease in sister - Plan: TSH  Morbid obesity (HCC)  Impacted cerumen of left ear  Overall she appears to be doing well.  Discussed that her BMI places her in the morbidly obese category and discussed the potential increased risks of diabetes, HTN, arthritis and other health conditions. Counseled her on healthy diet and exercise.  Counseled her on stopping smoking since she is only smoking a few per week and to cut back on daily alcohol use.  Up to date on immunizations per patient, no records available.  Pap smear done. She reports being positive for HPV in the past. STD screening performed per patient request including HSV as requested. Positive Hx of HSV1 in her genital region per patient but no record of this today.  Ear lavage performed to her left ear due to cerumen impaction by Sabrina. Patient tolerated this well.  Will follow up pending labs.

## 2016-08-19 LAB — RPR

## 2016-08-19 LAB — HIV ANTIBODY (ROUTINE TESTING W REFLEX): HIV: NONREACTIVE

## 2016-08-21 LAB — CYTOLOGY - PAP
CHLAMYDIA, DNA PROBE: NEGATIVE
DIAGNOSIS: NEGATIVE
Neisseria Gonorrhea: NEGATIVE
TRICH (WINDOWPATH): NEGATIVE

## 2016-08-24 ENCOUNTER — Encounter: Payer: Self-pay | Admitting: Internal Medicine

## 2016-08-24 LAB — CERVICOVAGINAL ANCILLARY ONLY: Herpes: NEGATIVE

## 2016-08-25 ENCOUNTER — Encounter: Payer: Self-pay | Admitting: Internal Medicine

## 2018-06-09 NOTE — Progress Notes (Signed)
Subjective:    Patient ID: Ashley Manning, female    DOB: 24-Sep-1985, 33 y.o.   MRN: [id number]  HPI Chief Complaint  Patient presents with  . fasting cpe    fasting cpe, last pap 2018, gets eyes checked yearly   She is here for a complete physical exam and has multiple concerns.  Previous medical care: Last CPE: March 2018   Other providers: Dr. Dione Booze -eyes   She requests a referral a hand specialist due to a 7 year history of a cyst at base of her right index finger. Non tender.   Complains of worsening fatigue for the past 6 months.   States her period is late. She has had some spotting and not her regular period.   Left vaginal tenderness x 1-2 months. Denies injury. Notices this when she is having period. Uses pads mostly.   Left low back pain for the past several months, no injury. Pain is non radiating. Pain is improved with laying down. Dull ache. Is not having to taking anything for this.   History of asthma and occasionally has flares with vigorous exercise. Does not have an albuterol inhaler.   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 Flu shot- declines  Health maintenance:  Mammogram: N/A Colonoscopy: N/A Last Gynecological Exam: 2018. History of +HPV. Last pap smear negative in 2018.  Last Menstrual cycle: 05/03/2018 Pregnancies: 0 Last Dental Exam: once every other year  Last Eye Exam: July 2019   Wears seatbelt always, smoke detectors in home and functioning, does not text while driving and feels safe in home environment.   Reviewed allergies, medications, past medical, surgical, family, and social history.   Review of Systems Review of Systems Constitutional: -fever, -chills, -sweats, -unexpected weight change,+fatigue ENT: -runny nose, -ear pain,  -sore throat Cardiology:  -chest pain, -palpitations, -edema Respiratory: -cough, -shortness of breath, -wheezing Gastroenterology: -abdominal pain, -nausea, -vomiting, -diarrhea, -constipation  Hematology: -bleeding or bruising problems Musculoskeletal: -arthralgias, -myalgias, -joint swelling, +back pain Ophthalmology: -vision changes Urology: -dysuria, -difficulty urinating, -hematuria, -urinary frequency, -urgency Neurology: -headache, -weakness, -tingling, -numbness       Objective:   Physical Exam BP 124/80   Pulse 77   Ht 5\' 6"  (1.676 m)   Wt 223 lb 12.8 oz (101.5 kg)   LMP 05/03/2018   BMI 36.12 kg/m   General Appearance:    Alert, cooperative, no distress, appears stated age  Head:    Normocephalic, without obvious abnormality, atraumatic  Eyes:    PERRL, conjunctiva/corneas clear, EOM's intact, fundi    benign  Ears:    Normal TM's and external ear canals  Nose:   Nares normal, mucosa normal, no drainage or sinus   tenderness  Throat:   Lips, mucosa, and tongue normal; teeth and gums normal  Neck:   Supple, no lymphadenopathy;  thyroid:  no   enlargement/tenderness/nodules; no carotid   bruit or JVD  Back:    Spine nontender, no curvature, ROM normal, no CVA     tenderness  Lungs:     Clear to auscultation bilaterally without wheezes, rales or     ronchi; respirations unlabored  Chest Wall:    No tenderness or deformity   Heart:    Regular rate and rhythm, S1 and S2 normal, no murmur, rub  or gallop  Breast Exam:    No tenderness, masses, or nipple discharge or inversion.      No axillary lymphadenopathy  Abdomen:     Soft, non-tender, nondistended, normoactive bowel sounds,    no masses, no hepatosplenomegaly  Genitalia:    Normal external genitalia without lesions.  BUS and vagina normal; cervix without lesions, or cervical motion tenderness. No abnormal vaginal discharge.  Uterus and adnexa not enlarged, nontender, no masses.  Pap performed and chaperone present.    Rectal:    Not performed due to age<40 and no related complaints  Extremities:   No clubbing, cyanosis or edema. Round, smooth nontender cyst at base of her right index finger.   Pulses:   2+ and symmetric all extremities  Skin:   Skin color, texture, turgor normal, no rashes or lesions  Lymph nodes:   Cervical, supraclavicular, and axillary nodes normal  Neurologic:   CNII-XII intact, normal strength, sensation and gait; reflexes 2+ and symmetric throughout          Psych:   Normal mood, affect, hygiene and grooming.     Urinalysis dipstick: +blood (spotting) negative otherwise.       Assessment & Plan:  Routine general medical examination at a health care facility - Plan: POCT Urinalysis DIP (Proadvantage Device), CBC with Differential/Platelet, Comprehensive metabolic panel, TSH, T4, free, T3  Obesity (BMI 30-39.9) - Plan: Hemoglobin A1c  Late menses - Plan: POCT urine pregnancy  Cyst of joint of hand, right - Plan: Ambulatory referral to Hand Surgery  Mild intermittent asthma without complication - Plan: albuterol (PROVENTIL HFA;VENTOLIN HFA) 108 (90 Base) MCG/ACT inhaler  Family history of thyroid disease in sister - Plan: TSH, T4, free, T3  Fatigue, unspecified type - Plan: CBC with Differential/Platelet, Comprehensive metabolic panel, TSH, T4, free, T3, VITAMIN D 25 Hydroxy (Vit-D Deficiency, Fractures), Vitamin B12, Hemoglobin A1c  Screening for cervical cancer - Plan: Cytology - PAP([place])  Screening for lipid disorders - Plan: Lipid panel  Screen for STD (sexually transmitted disease) - Plan: RPR, HIV Antibody (routine testing w rflx)  Family history of diabetes mellitus  She was seen today for a fasting CPE and for chronic health issues.  Discussed multiple etiologies for fatigue. Will check labs. No sign of infectious process or sleep apnea.  Back exam unremarkable. Excellent ROM and strength.  Screen for thyroid disease and diabetes due to risk factors  including family history.  Referral to hand specialist for hand cyst per patient request.  Pap smear done, STD screening done. Chaperone present.  Immunization counseling done. Declines flu shot. Tdap UTD. Discussed Gardasil.  Counseling on healthy diet and exercise for weight loss and overall health.  Asthma is not bothersome unless she has a lengthy vigorous workout. Albuterol prescribed.  Follow up pending lab results.

## 2018-06-10 ENCOUNTER — Other Ambulatory Visit (HOSPITAL_COMMUNITY)
Admission: RE | Admit: 2018-06-10 | Discharge: 2018-06-10 | Disposition: A | Discharge status: Home / Self Care | Payer: 59 | Source: Ambulatory Visit | Attending: Family Medicine | Admitting: Family Medicine

## 2018-06-10 ENCOUNTER — Ambulatory Visit (INDEPENDENT_AMBULATORY_CARE_PROVIDER_SITE_OTHER): Payer: 59 | Admitting: Family Medicine

## 2018-06-10 ENCOUNTER — Encounter: Payer: Self-pay | Admitting: Family Medicine

## 2018-06-10 VITALS — BP 124/80 | HR 77 | Ht 66.0 in | Wt 223.8 lb

## 2018-06-10 DIAGNOSIS — Z124 Encounter for screening for malignant neoplasm of cervix: Secondary | ICD-10-CM | POA: Diagnosis not present

## 2018-06-10 DIAGNOSIS — J4520 Mild intermittent asthma, uncomplicated: Secondary | ICD-10-CM | POA: Diagnosis not present

## 2018-06-10 DIAGNOSIS — Z833 Family history of diabetes mellitus: Secondary | ICD-10-CM | POA: Insufficient documentation

## 2018-06-10 DIAGNOSIS — Z13220 Encounter for screening for lipoid disorders: Secondary | ICD-10-CM | POA: Diagnosis not present

## 2018-06-10 DIAGNOSIS — E669 Obesity, unspecified: Secondary | ICD-10-CM

## 2018-06-10 DIAGNOSIS — Z113 Encounter for screening for infections with a predominantly sexual mode of transmission: Secondary | ICD-10-CM

## 2018-06-10 DIAGNOSIS — M25841 Other specified joint disorders, right hand: Secondary | ICD-10-CM | POA: Diagnosis not present

## 2018-06-10 DIAGNOSIS — N926 Irregular menstruation, unspecified: Secondary | ICD-10-CM | POA: Diagnosis not present

## 2018-06-10 DIAGNOSIS — Z0000 Encounter for general adult medical examination without abnormal findings: Secondary | ICD-10-CM

## 2018-06-10 DIAGNOSIS — Z8349 Family history of other endocrine, nutritional and metabolic diseases: Secondary | ICD-10-CM

## 2018-06-10 DIAGNOSIS — R5383 Other fatigue: Secondary | ICD-10-CM

## 2018-06-10 LAB — POCT URINALYSIS DIP (PROADVANTAGE DEVICE)
BILIRUBIN UA: NEGATIVE
GLUCOSE UA: NEGATIVE mg/dL
Ketones, POC UA: NEGATIVE mg/dL
LEUKOCYTES UA: NEGATIVE
NITRITE UA: NEGATIVE
Protein Ur, POC: NEGATIVE mg/dL
Specific Gravity, Urine: 1.03
Urobilinogen, Ur: NEGATIVE
pH, UA: 6 (ref 5.0–8.0)

## 2018-06-10 LAB — POCT URINE PREGNANCY: Preg Test, Ur: NEGATIVE

## 2018-06-10 MED ORDER — ALBUTEROL SULFATE HFA 108 (90 BASE) MCG/ACT IN AERS: 2.0000 | INHALATION_SPRAY | Freq: Four times a day (QID) | RESPIRATORY_TRACT | 0 refills | Status: AC | PRN

## 2018-06-10 NOTE — Patient Instructions (Addendum)
You will receive a call from the hand specialist.   Continue taking good care of yourself by eating a healthy, well balanced diet and getting at least 150 minutes of physical activity per week.   I strongly encourage you to stop smoking.   We will call you with your lab results.   Let me know if any of your symptoms we discussed today are worsening or if you develop any new symptoms.   Preventive Care 18-39 Years, Female Preventive care refers to lifestyle choices and visits with your health care provider that can promote health and wellness. What does preventive care include?   A yearly physical exam. This is also called an annual well check.  Dental exams once or twice a year.  Routine eye exams. Ask your health care provider how often you should have your eyes checked.  Personal lifestyle choices, including: ? Daily care of your teeth and gums. ? Regular physical activity. ? Eating a healthy diet. ? Avoiding tobacco and drug use. ? Limiting alcohol use. ? Practicing safe sex. ? Taking vitamin and mineral supplements as recommended by your health care provider. What happens during an annual well check? The services and screenings done by your health care provider during your annual well check will depend on your age, overall health, lifestyle risk factors, and family history of disease. Counseling Your health care provider may ask you questions about your:  Alcohol use.  Tobacco use.  Drug use.  Emotional well-being.  Home and relationship well-being.  Sexual activity.  Eating habits.  Work and work Statistician.  Method of birth control.  Menstrual cycle.  Pregnancy history. Screening You may have the following tests or measurements:  Height, weight, and BMI.  Diabetes screening. This is done by checking your blood sugar (glucose) after you have not eaten for a while (fasting).  Blood pressure.  Lipid and cholesterol levels. These may be checked every  5 years starting at age 77.  Skin check.  Hepatitis C blood test.  Hepatitis B blood test.  Sexually transmitted disease (STD) testing.  BRCA-related cancer screening. This may be done if you have a family history of breast, ovarian, tubal, or peritoneal cancers.  Pelvic exam and Pap test. This may be done every 3 years starting at age 18. Starting at age 104, this may be done every 5 years if you have a Pap test in combination with an HPV test. Discuss your test results, treatment options, and if necessary, the need for more tests with your health care provider. Vaccines Your health care provider may recommend certain vaccines, such as:  Influenza vaccine. This is recommended every year.  Tetanus, diphtheria, and acellular pertussis (Tdap, Td) vaccine. You may need a Td booster every 10 years.  Varicella vaccine. You may need this if you have not been vaccinated.  HPV vaccine. If you are 4 or younger, you may need three doses over 6 months.  Measles, mumps, and rubella (MMR) vaccine. You may need at least one dose of MMR. You may also need a second dose.  Pneumococcal 13-valent conjugate (PCV13) vaccine. You may need this if you have certain conditions and were not previously vaccinated.  Pneumococcal polysaccharide (PPSV23) vaccine. You may need one or two doses if you smoke cigarettes or if you have certain conditions.  Meningococcal vaccine. One dose is recommended if you are age 80-21 years and a first-year college student living in a residence hall, or if you have one of several medical conditions.  You may also need additional booster doses.  Hepatitis A vaccine. You may need this if you have certain conditions or if you travel or work in places where you may be exposed to hepatitis A.  Hepatitis B vaccine. You may need this if you have certain conditions or if you travel or work in places where you may be exposed to hepatitis B.  Haemophilus influenzae type b (Hib)  vaccine. You may need this if you have certain risk factors. Talk to your health care provider about which screenings and vaccines you need and how often you need them. This information is not intended to replace advice given to you by your health care provider. Make sure you discuss any questions you have with your health care provider. Document Released: 07/07/2001 Document Revised: 12/22/2016 Document Reviewed: 03/12/2015 Elsevier Interactive Patient Education  2019 [employer].

## 2018-06-11 ENCOUNTER — Encounter: Payer: Self-pay | Admitting: Family Medicine

## 2018-06-11 ENCOUNTER — Other Ambulatory Visit: Payer: Self-pay | Admitting: Family Medicine

## 2018-06-11 DIAGNOSIS — E559 Vitamin D deficiency, unspecified: Secondary | ICD-10-CM

## 2018-06-11 HISTORY — DX: Vitamin D deficiency, unspecified: E55.9

## 2018-06-11 LAB — CBC WITH DIFFERENTIAL/PLATELET
BASOS ABS: 0.1 10*3/uL (ref 0.0–0.2)
Basos: 1 %
EOS (ABSOLUTE): 0.2 10*3/uL (ref 0.0–0.4)
Eos: 4 %
HEMOGLOBIN: 12.5 g/dL (ref 11.1–15.9)
Hematocrit: 38.4 % (ref 34.0–46.6)
Immature Grans (Abs): 0 10*3/uL (ref 0.0–0.1)
Immature Granulocytes: 0 %
Lymphocytes Absolute: 1.5 10*3/uL (ref 0.7–3.1)
Lymphs: 27 %
MCH: 29.5 pg (ref 26.6–33.0)
MCHC: 32.6 g/dL (ref 31.5–35.7)
MCV: 91 fL (ref 79–97)
Monocytes Absolute: 0.3 10*3/uL (ref 0.1–0.9)
Monocytes: 6 %
Neutrophils Absolute: 3.3 10*3/uL (ref 1.4–7.0)
Neutrophils: 62 %
Platelets: 252 10*3/uL (ref 150–450)
RBC: 4.24 x10E6/uL (ref 3.77–5.28)
RDW: 11.9 % (ref 11.7–15.4)
WBC: 5.4 10*3/uL (ref 3.4–10.8)

## 2018-06-11 LAB — COMPREHENSIVE METABOLIC PANEL
ALT: 9 IU/L (ref 0–32)
AST: 15 IU/L (ref 0–40)
Albumin/Globulin Ratio: 1.9 (ref 1.2–2.2)
Albumin: 4.3 g/dL (ref 3.5–5.5)
Alkaline Phosphatase: 48 IU/L (ref 39–117)
BUN/Creatinine Ratio: 9 (ref 9–23)
BUN: 9 mg/dL (ref 6–20)
Bilirubin Total: 0.5 mg/dL (ref 0.0–1.2)
CO2: 20 mmol/L (ref 20–29)
Calcium: 9.1 mg/dL (ref 8.7–10.2)
Chloride: 108 mmol/L — ABNORMAL HIGH (ref 96–106)
Creatinine, Ser: 0.99 mg/dL (ref 0.57–1.00)
GFR calc Af Amer: 88 mL/min/{1.73_m2} (ref >59)
GFR calc non Af Amer: 77 mL/min/{1.73_m2} (ref >59)
GLUCOSE: 84 mg/dL (ref 65–99)
Globulin, Total: 2.3 g/dL (ref 1.5–4.5)
Potassium: 4.1 mmol/L (ref 3.5–5.2)
Sodium: 141 mmol/L (ref 134–144)
Total Protein: 6.6 g/dL (ref 6.0–8.5)

## 2018-06-11 LAB — T4, FREE: Free T4: 1.08 ng/dL (ref 0.82–1.77)

## 2018-06-11 LAB — LIPID PANEL
CHOL/HDL RATIO: 2.9 ratio (ref 0.0–4.4)
Cholesterol, Total: 164 mg/dL (ref 100–199)
HDL: 56 mg/dL (ref >39)
LDL Calculated: 97 mg/dL (ref 0–99)
Triglycerides: 54 mg/dL (ref 0–149)
VLDL Cholesterol Cal: 11 mg/dL (ref 5–40)

## 2018-06-11 LAB — T3: T3, Total: 124 ng/dL (ref 71–180)

## 2018-06-11 LAB — VITAMIN B12: Vitamin B-12: 653 pg/mL (ref 232–1245)

## 2018-06-11 LAB — RPR: RPR Ser Ql: NONREACTIVE

## 2018-06-11 LAB — VITAMIN D 25 HYDROXY (VIT D DEFICIENCY, FRACTURES): VIT D 25 HYDROXY: 19.4 ng/mL — AB (ref 30.0–100.0)

## 2018-06-11 LAB — HIV ANTIBODY (ROUTINE TESTING W REFLEX): HIV Screen 4th Generation wRfx: NONREACTIVE

## 2018-06-11 LAB — HEMOGLOBIN A1C
Est. average glucose Bld gHb Est-mCnc: 91 mg/dL
Hgb A1c MFr Bld: 4.8 % (ref 4.8–5.6)

## 2018-06-11 LAB — TSH: TSH: 1.26 u[IU]/mL (ref 0.450–4.500)

## 2018-06-11 MED ORDER — VITAMIN D (ERGOCALCIFEROL) 1.25 MG (50000 UNIT) PO CAPS: 50000.0000 [IU] | ORAL_CAPSULE | ORAL | 0 refills | Status: AC

## 2018-06-15 LAB — CYTOLOGY - PAP
Chlamydia: NEGATIVE
Diagnosis: NEGATIVE
HPV: NOT DETECTED
NEISSERIA GONORRHEA: NEGATIVE
Trichomonas: NEGATIVE

## 2018-06-29 ENCOUNTER — Telehealth: Payer: Self-pay

## 2018-06-29 NOTE — Telephone Encounter (Signed)
Lab results were given to pt. KH

## 2018-07-06 ENCOUNTER — Other Ambulatory Visit: Payer: Self-pay

## 2019-05-08 ENCOUNTER — Other Ambulatory Visit: Payer: Self-pay

## 2019-05-08 DIAGNOSIS — Z20822 Contact with and (suspected) exposure to covid-19: Secondary | ICD-10-CM

## 2019-05-09 LAB — NOVEL CORONAVIRUS, NAA: SARS-CoV-2, NAA: NOT DETECTED

## 2022-04-08 ENCOUNTER — Encounter: Payer: Self-pay | Admitting: Internal Medicine
